# Patient Record
Sex: Male | Born: 1960 | Race: White | Hispanic: No | Marital: Married | Smoking: Former smoker
Health system: Southern US, Community
[De-identification: ages and names within clinical notes are randomized; demographics above are authoritative.]

## PROBLEM LIST (undated history)

## (undated) HISTORY — PX: NO PAST SURGERIES: SHX2092

---

## 2020-09-12 ENCOUNTER — Encounter (HOSPITAL_BASED_OUTPATIENT_CLINIC_OR_DEPARTMENT_OTHER): Payer: Self-pay | Admitting: Emergency Medicine

## 2020-09-12 ENCOUNTER — Emergency Department (HOSPITAL_BASED_OUTPATIENT_CLINIC_OR_DEPARTMENT_OTHER)
Admission: EM | Admit: 2020-09-12 | Discharge: 2020-09-12 | Disposition: A | Discharge status: Home / Self Care | Payer: BC Managed Care – PPO | Attending: Emergency Medicine | Admitting: Emergency Medicine

## 2020-09-12 ENCOUNTER — Emergency Department (HOSPITAL_BASED_OUTPATIENT_CLINIC_OR_DEPARTMENT_OTHER): Payer: BC Managed Care – PPO

## 2020-09-12 ENCOUNTER — Other Ambulatory Visit: Payer: Self-pay

## 2020-09-12 DIAGNOSIS — Z87891 Personal history of nicotine dependence: Secondary | ICD-10-CM | POA: Diagnosis not present

## 2020-09-12 DIAGNOSIS — G510 Bell's palsy: Secondary | ICD-10-CM | POA: Diagnosis present

## 2020-09-12 DIAGNOSIS — Z79899 Other long term (current) drug therapy: Secondary | ICD-10-CM | POA: Insufficient documentation

## 2020-09-12 MED ORDER — VALACYCLOVIR HCL 500 MG PO TABS
1000.0000 mg | ORAL_TABLET | ORAL | Status: AC
  Administered 2020-09-12: 1000 mg via ORAL
  Filled 2020-09-12: qty 2

## 2020-09-12 MED ORDER — VALACYCLOVIR HCL 1 G PO TABS: 1000.0000 mg | ORAL_TABLET | Freq: Three times a day (TID) | ORAL | 0 refills | Status: AC

## 2020-09-12 MED ORDER — PREDNISONE 50 MG PO TABS
60.0000 mg | ORAL_TABLET | Freq: Once | ORAL | Status: AC
  Administered 2020-09-12: 60 mg via ORAL
  Filled 2020-09-12: qty 1

## 2020-09-12 MED ORDER — PREDNISONE 20 MG PO TABS: ORAL_TABLET | ORAL | 0 refills | Status: DC

## 2020-09-12 NOTE — ED Triage Notes (Addendum)
Pt with several complaints. States this started with sores on his tongue and swelling of his tonsils. States that this is ongoing since labor day. Has been seen twice by an MD. C/o the right side of his face feeling numb and his right eye feeling like his right eye feels dry. Appears to be a bells palsy.

## 2020-09-12 NOTE — ED Provider Notes (Signed)
[facility] EMERGENCY DEPARTMENT Provider Note   CSN: [id number] Arrival date & time: 09/12/20  0534     History Chief Complaint  Patient presents with  . bells palsy    Drew Rose is a 59 y.o. male.  The history is provided by the patient.  Illness Location:  Face Quality:  Droop and eye feels gritty and taste is off  Severity:  Moderate Onset quality:  Gradual Timing:  Constant Progression:  Unchanged Chronicity:  New Context:  Had ulcers in the mouth around labor day  Relieved by:  Nothing  Worsened by:  Nothing  Ineffective treatments:  Was on antibiotics  Associated symptoms: no abdominal pain, no chest pain, no congestion, no cough, no diarrhea, no ear pain, no fatigue, no fever, no headaches, no loss of consciousness, no myalgias, no nausea, no rash, no rhinorrhea, no shortness of breath, no sore throat, no vomiting and no wheezing   Risk factors:  None       History reviewed. No pertinent past medical history.  There are no problems to display for this patient.   History reviewed. No pertinent surgical history.     History reviewed. No pertinent family history.  Social History   Tobacco Use  . Smoking status: Former Games developer  . Smokeless tobacco: Never Used  Substance Use Topics  . Alcohol use: Yes    Comment: occasional   . Drug use: Yes    Types: Marijuana    Home Medications Prior to Admission medications   Medication Sig Start Date End Date Taking? Authorizing Provider  amoxicillin-clavulanate (AUGMENTIN) 875-125 MG tablet Take 1 tablet by mouth 2 (two) times daily.   Yes [provider]    Allergies    Patient has no allergy information on record.  Review of Systems   Review of Systems  Constitutional: Negative for fatigue and fever.  HENT: Positive for mouth sores. Negative for congestion, ear pain, rhinorrhea and sore throat.   Eyes: Negative for pain, redness and visual disturbance.  Respiratory: Negative for  cough, shortness of breath and wheezing.   Cardiovascular: Negative for chest pain.  Gastrointestinal: Negative for abdominal pain, diarrhea, nausea and vomiting.  Genitourinary: Negative for dysuria.  Musculoskeletal: Negative for myalgias.  Skin: Negative for rash.  Neurological: Positive for facial asymmetry. Negative for loss of consciousness, speech difficulty, weakness, numbness and headaches.  Psychiatric/Behavioral: Negative for agitation.  All other systems reviewed and are negative.   Physical Exam Updated Vital Signs BP (!) 155/102   Pulse 77   Temp 98.3 F (36.8 C)   Resp 18   Ht 5\' 9"  (1.753 m)   Wt 83.5 kg   SpO2 98%   BMI 27.17 kg/m   Physical Exam Vitals and nursing note reviewed. Exam conducted with a chaperone present.  Constitutional:      Appearance: Normal appearance.  HENT:     Head: Normocephalic and atraumatic.     Nose: Nose normal.     Mouth/Throat:     Mouth: Mucous membranes are moist.     Pharynx: Oropharynx is clear.     Comments: No oral lesions nor swelling of the tonsils  Eyes:     Extraocular Movements: Extraocular movements intact.     Conjunctiva/sclera: Conjunctivae normal.     Pupils: Pupils are equal, round, and reactive to light.  Cardiovascular:     Rate and Rhythm: Normal rate and regular rhythm.  Pulmonary:     Effort: Pulmonary effort is normal.  Breath sounds: Normal breath sounds.  Abdominal:     General: Abdomen is flat. Bowel sounds are normal.     Palpations: Abdomen is soft.     Tenderness: There is no abdominal tenderness. There is no guarding.  Musculoskeletal:        General: Normal range of motion.     Cervical back: Normal range of motion and neck supple.  Skin:    General: Skin is warm and dry.     Capillary Refill: Capillary refill takes less than 2 seconds.  Neurological:     General: No focal deficit present.     Mental Status: He is alert and oriented to person, place, and time.     Deep Tendon  Reflexes: Reflexes normal.     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  Swelling of tonsils. EXAM: CHEST - 2 VIEW COMPARISON:  None. FINDINGS: The heart size and mediastinal contours are within normal limits. Both lungs are clear. 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 IMPRESSION: No active cardiopulmonary disease. Electronically Signed   By: Signa Kell M.D.   On: 09/12/2020 06:23   CT Head Wo Contrast  Result Date: 09/12/2020 CLINICAL DATA:  Maxillofacial pain which began with sore on tongue and swelling up tonsils. EXAM: CT HEAD WITHOUT CONTRAST TECHNIQUE: Contiguous axial images were obtained from the base of the skull through the vertex without intravenous contrast. COMPARISON:  None. FINDINGS: Brain: No evidence of acute infarction, hemorrhage, hydrocephalus, extra-axial collection or mass lesion/mass effect. Vascular: No hyperdense vessel or unexpected calcification. Skull: Normal. Negative for fracture or focal lesion. Sinuses/Orbits: No acute finding. Other: None. IMPRESSION: No acute intracranial abnormalities. Normal brain. Electronically Signed   By: Signa Kell M.D.   On: 09/12/2020 06:27    Procedures Procedures (including critical care time)  Medications Ordered in ED Medications  valACYclovir (VALTREX) tablet 1,000 mg (1,000 mg Oral Given 09/12/20 8182)  predniSONE (DELTASONE) tablet 60 mg (60 mg Oral Given 09/12/20 0608)    ED Course  I have reviewed the triage vital signs and the nursing notes.  Pertinent labs & imaging results that were available during my care of the patient were reviewed by me and considered in my medical decision making (see chart for  details).  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  I suspect the mouth lesions were herpetic in nature, but they are now resolved.  I will start valtex and steroids and have patient follow up with PMD.  I recommend rewetting drops for the eye to keep the cornea lubricated.  Follow up with eye care professional for recheck.    Lamarius Dirr was evaluated in Emergency Department on 09/12/2020 for the symptoms described in the history of present illness. He was evaluated in the context of the global COVID-19 pandemic, which necessitated consideration that the patient might be at risk for infection with the SARS-CoV-2 virus that causes COVID-19. Institutional protocols and algorithms that pertain to the evaluation of patients at risk for COVID-19 are in a state of rapid change based on information released by regulatory bodies including the CDC and federal and state organizations. These policies and algorithms  were followed during the patient's care in the ED.   Final Clinical Impression(s) / ED Diagnoses Return for intractable cough, coughing up blood,fevers >100.4 unrelieved by medication, shortness of breath, intractable vomiting, chest pain, shortness of breath, weakness,numbness, changes in speech, facial asymmetry,abdominal pain, passing out,Inability to tolerate liquids or food, cough, altered mental status or any concerns. No signs of systemic illness or infection. The patient is nontoxic-appearing on exam and vital signs are within normal limits.   I have reviewed the triage vital signs and the nursing notes. Pertinent labs &imaging results that were available during my care of the patient were reviewed by me and considered in my medical decision making (see chart for details).After history, exam, and medical workup I feel the patient has beenappropriately medically screened and is safe for discharge home. Pertinent diagnoses were discussed with the patient. Patient  was given return precautions.   Sherece Gambrill, MD 09/12/20 0762

## 2020-09-18 ENCOUNTER — Emergency Department (HOSPITAL_BASED_OUTPATIENT_CLINIC_OR_DEPARTMENT_OTHER): Payer: BC Managed Care – PPO

## 2020-09-18 ENCOUNTER — Emergency Department (HOSPITAL_BASED_OUTPATIENT_CLINIC_OR_DEPARTMENT_OTHER)
Admission: EM | Admit: 2020-09-18 | Discharge: 2020-09-19 | Disposition: A | Discharge status: Home / Self Care | Payer: BC Managed Care – PPO | Attending: Emergency Medicine | Admitting: Emergency Medicine

## 2020-09-18 ENCOUNTER — Encounter (HOSPITAL_BASED_OUTPATIENT_CLINIC_OR_DEPARTMENT_OTHER): Payer: Self-pay | Admitting: Emergency Medicine

## 2020-09-18 ENCOUNTER — Other Ambulatory Visit: Payer: Self-pay

## 2020-09-18 DIAGNOSIS — R109 Unspecified abdominal pain: Secondary | ICD-10-CM | POA: Insufficient documentation

## 2020-09-18 DIAGNOSIS — R112 Nausea with vomiting, unspecified: Secondary | ICD-10-CM | POA: Diagnosis not present

## 2020-09-18 DIAGNOSIS — Z20822 Contact with and (suspected) exposure to covid-19: Secondary | ICD-10-CM | POA: Insufficient documentation

## 2020-09-18 DIAGNOSIS — Z87891 Personal history of nicotine dependence: Secondary | ICD-10-CM | POA: Diagnosis not present

## 2020-09-18 DIAGNOSIS — R066 Hiccough: Secondary | ICD-10-CM

## 2020-09-18 LAB — COMPREHENSIVE METABOLIC PANEL
ALT: 60 U/L — ABNORMAL HIGH (ref 0–44)
AST: 33 U/L (ref 15–41)
Albumin: 4 g/dL (ref 3.5–5.0)
Alkaline Phosphatase: 47 U/L (ref 38–126)
Anion gap: 13 (ref 5–15)
BUN: 24 mg/dL — ABNORMAL HIGH (ref 6–20)
CO2: 19 mmol/L — ABNORMAL LOW (ref 22–32)
Calcium: 9.8 mg/dL (ref 8.9–10.3)
Chloride: 104 mmol/L (ref 98–111)
Creatinine, Ser: 1.06 mg/dL (ref 0.61–1.24)
GFR calc Af Amer: >60 mL/min (ref >60)
GFR calc non Af Amer: >60 mL/min (ref >60)
Glucose, Bld: 108 mg/dL — ABNORMAL HIGH (ref 70–99)
Potassium: 3.9 mmol/L (ref 3.5–5.1)
Sodium: 136 mmol/L (ref 135–145)
Total Bilirubin: 1 mg/dL (ref 0.3–1.2)
Total Protein: 7.2 g/dL (ref 6.5–8.1)

## 2020-09-18 LAB — TROPONIN I (HIGH SENSITIVITY)
Troponin I (High Sensitivity): 3 ng/L (ref <18)
Troponin I (High Sensitivity): 3 ng/L (ref <18)

## 2020-09-18 LAB — URINALYSIS, ROUTINE W REFLEX MICROSCOPIC
Bilirubin Urine: NEGATIVE
Glucose, UA: NEGATIVE mg/dL
Hgb urine dipstick: NEGATIVE
Ketones, ur: >80 mg/dL — AB
Leukocytes,Ua: NEGATIVE
Nitrite: NEGATIVE
Protein, ur: NEGATIVE mg/dL
Specific Gravity, Urine: 1.01 (ref 1.005–1.030)
pH: 8 (ref 5.0–8.0)

## 2020-09-18 LAB — CBC
HCT: 37.8 % — ABNORMAL LOW (ref 39.0–52.0)
Hemoglobin: 13.3 g/dL (ref 13.0–17.0)
MCH: 31.1 pg (ref 26.0–34.0)
MCHC: 35.2 g/dL (ref 30.0–36.0)
MCV: 88.3 fL (ref 80.0–100.0)
Platelets: 415 10*3/uL — ABNORMAL HIGH (ref 150–400)
RBC: 4.28 MIL/uL (ref 4.22–5.81)
RDW: 13 % (ref 11.5–15.5)
WBC: 11.4 10*3/uL — ABNORMAL HIGH (ref 4.0–10.5)
nRBC: 0 % (ref 0.0–0.2)

## 2020-09-18 LAB — RESPIRATORY PANEL BY RT PCR (FLU A&B, COVID)
Influenza A by PCR: NEGATIVE
Influenza B by PCR: NEGATIVE
SARS Coronavirus 2 by RT PCR: NEGATIVE

## 2020-09-18 LAB — LIPASE, BLOOD: Lipase: 27 U/L (ref 11–51)

## 2020-09-18 MED ORDER — IOHEXOL 300 MG/ML  SOLN
100.0000 mL | Freq: Once | INTRAMUSCULAR | Status: AC | PRN
  Administered 2020-09-18: 100 mL via INTRAVENOUS

## 2020-09-18 MED ORDER — SODIUM CHLORIDE 0.9 % IV BOLUS
1000.0000 mL | Freq: Once | INTRAVENOUS | Status: AC
  Administered 2020-09-18: 1000 mL via INTRAVENOUS

## 2020-09-18 MED ORDER — METOCLOPRAMIDE HCL 5 MG/ML IJ SOLN
10.0000 mg | Freq: Once | INTRAMUSCULAR | Status: AC
  Administered 2020-09-18: 10 mg via INTRAVENOUS
  Filled 2020-09-18: qty 2

## 2020-09-18 MED ORDER — ONDANSETRON HCL 4 MG/2ML IJ SOLN: 4.0000 mg | Freq: Once | INTRAMUSCULAR | Status: DC | PRN

## 2020-09-18 MED ORDER — PROMETHAZINE HCL 25 MG PO TABS: 25.0000 mg | ORAL_TABLET | Freq: Four times a day (QID) | ORAL | 0 refills | Status: DC | PRN

## 2020-09-18 MED ORDER — ONDANSETRON HCL 4 MG/2ML IJ SOLN
4.0000 mg | Freq: Once | INTRAMUSCULAR | Status: AC
  Administered 2020-09-18: 4 mg via INTRAVENOUS
  Filled 2020-09-18: qty 2

## 2020-09-18 MED ORDER — OMEPRAZOLE 20 MG PO CPDR: 20.0000 mg | DELAYED_RELEASE_CAPSULE | Freq: Every day | ORAL | 0 refills | Status: AC

## 2020-09-18 MED ORDER — DIPHENHYDRAMINE HCL 50 MG/ML IJ SOLN
25.0000 mg | Freq: Once | INTRAMUSCULAR | Status: AC
  Administered 2020-09-18: 25 mg via INTRAVENOUS
  Filled 2020-09-18: qty 1

## 2020-09-18 NOTE — ED Triage Notes (Signed)
Pt here with emesis since yesterday evening.

## 2020-09-18 NOTE — Progress Notes (Signed)
Pt dry heaving and states needs to get that under control before he can proceed with CT, RN aware.

## 2020-09-18 NOTE — ED Notes (Signed)
Pt encouraged to void

## 2020-09-18 NOTE — ED Provider Notes (Signed)
[facility] EMERGENCY DEPARTMENT Provider Note   CSN: [id number] Arrival date & time: 09/18/20  1740     History Chief Complaint  Patient presents with  . Emesis    Drew Rose is a 59 y.o. male.  HPI      Last night 8PM started to have nausea, vomiting Dry heaves, hiccups Slept ok, woke up early, then slept this afternoon however symptoms have continued Tried drinking water, applesauce  Dry heaves, hiccups. havent been able to keep much of anything down, did have pedialyte this AM and kept that down.  2 weeks ago had ulcers and plaques in back of tongue, sore throat, got prednisone and amoxicillin, dry heaves Then diagnosed with bells palsy, prednisone/valtrex  This time no sore throat  No headache, denies numbness, weakness, difficulty talking or walking, visual changes-no change in bells palsy   No chest pain or dyspnea  Abdominal pain epigastric area  No known sick contacts  History reviewed. No pertinent past medical history.  There are no problems to display for this patient.   History reviewed. No pertinent surgical history.     History reviewed. No pertinent family history.  Social History   Tobacco Use  . Smoking status: Former Games developer  . Smokeless tobacco: Never Used  Substance Use Topics  . Alcohol use: Yes    Comment: occasional   . Drug use: Yes    Types: Marijuana    Home Medications Prior to Admission medications   Medication Sig Start Date End Date Taking? Authorizing Provider  amoxicillin-clavulanate (AUGMENTIN) 875-125 MG tablet Take 1 tablet by mouth 2 (two) times daily.    [provider]  omeprazole (PRILOSEC) 20 MG capsule Take 1 capsule (20 mg total) by mouth daily. 09/18/20   Palumbo, April, MD  predniSONE (DELTASONE) 20 MG tablet 3 tabs po day one, then 2 po daily x 4 days 09/12/20   Nicanor Alcon, April, MD  promethazine (PHENERGAN) 25 MG tablet Take 1 tablet (25 mg total) by mouth every 6 (six) hours as  needed for nausea or vomiting. 09/18/20   Palumbo, April, MD  valACYclovir (VALTREX) 1000 MG tablet Take 1 tablet (1,000 mg total) by mouth 3 (three) times daily for 7 days. 09/12/20 09/19/20  Palumbo, April, MD    Allergies    Patient has no known allergies.  Review of Systems   Review of Systems  Constitutional: Negative for fever.  HENT: Negative for sore throat.   Eyes: Negative for visual disturbance.  Respiratory: Negative for shortness of breath.   Cardiovascular: Negative for chest pain.  Gastrointestinal: Positive for abdominal pain, constipation, nausea and vomiting. Negative for diarrhea.  Genitourinary: Negative for difficulty urinating.  Musculoskeletal: Negative for back pain and neck stiffness.  Skin: Negative for rash.  Neurological: Positive for facial asymmetry and light-headedness. Negative for dizziness, syncope, speech difficulty, weakness, numbness and headaches.    Physical Exam Updated Vital Signs BP 122/69   Pulse 62   Temp 98.9 F (37.2 C) (Oral)   Resp 17   SpO2 97%   Physical Exam Vitals and nursing note reviewed.  Constitutional:      General: He is not in acute distress.    Appearance: He is well-developed. He is not diaphoretic.  HENT:     Head: Normocephalic and atraumatic.  Eyes:     Conjunctiva/sclera: Conjunctivae normal.  Cardiovascular:     Rate and Rhythm: Normal rate and regular rhythm.     Heart sounds: Normal heart sounds. No murmur  heard.  No friction rub. No gallop.   Pulmonary:     Effort: Pulmonary effort is normal. No respiratory distress.     Breath sounds: Normal breath sounds. No wheezing or rales.  Abdominal:     General: There is no distension.     Palpations: Abdomen is soft.     Tenderness: There is no abdominal tenderness. There is no guarding.  Musculoskeletal:     Cervical back: Normal range of motion.  Skin:    General: Skin is warm and dry.  Neurological:     Mental Status: He is alert and oriented to  person, place, and time.     ED Results / Procedures / Treatments   Labs (all labs ordered are listed, but only abnormal results are displayed) Labs Reviewed  COMPREHENSIVE METABOLIC PANEL - Abnormal; Notable for the following components:      Result Value   CO2 19 (*)    Glucose, Bld 108 (*)    BUN 24 (*)    ALT 60 (*)    All other components within normal limits  CBC - Abnormal; Notable for the following components:   WBC 11.4 (*)    HCT 37.8 (*)    Platelets 415 (*)    All other components within normal limits  URINALYSIS, ROUTINE W REFLEX MICROSCOPIC - Abnormal; Notable for the following components:   APPearance CLOUDY (*)    Ketones, ur >80 (*)    All other components within normal limits  RESPIRATORY PANEL BY RT PCR (FLU A&B, COVID)  LIPASE, BLOOD  TROPONIN I (HIGH SENSITIVITY)  TROPONIN I (HIGH SENSITIVITY)    EKG EKG Interpretation  Date/Time:  Saturday September 18 2020 19:12:14 EDT Ventricular Rate:  63 PR Interval:    QRS Duration: 100 QT Interval:  393 QTC Calculation: 403 R Axis:   -38 Text Interpretation: Sinus rhythm Left axis deviation Low voltage, precordial leads Abnormal R-wave progression, early transition No previous ECGs available Confirmed by Alvira Monday ([id number]) on 09/18/2020 8:27:04 PM   Radiology CT ABDOMEN PELVIS W CONTRAST  Result Date: 09/18/2020 CLINICAL DATA:  Diverticulitis suspected.  Vomiting. EXAM: CT ABDOMEN AND PELVIS WITH CONTRAST TECHNIQUE: Multidetector CT imaging of the abdomen and pelvis was performed using the standard protocol following bolus administration of intravenous contrast. CONTRAST:  OMNIPAQUE IOHEXOL 300 MG/ML  SOLN COMPARISON:  None. FINDINGS: Lower chest: Lung bases are clear. No consolidation or pleural fluid. Hepatobiliary: No focal liver abnormality is seen. Mild decreased hepatic density consistent with steatosis. No gallstones, gallbladder wall thickening, or biliary dilatation. Pancreas: No ductal  dilatation or inflammation. Spleen: Normal in size without focal abnormality. Adrenals/Urinary Tract: Normal adrenal glands. No hydronephrosis or perinephric edema. Homogeneous renal enhancement with symmetric excretion on delayed phase imaging. Tiny cortical hypodensity in the upper right and lower left kidney are too small to characterize but likely small cyst. Urinary bladder is physiologically distended without wall thickening. Stomach/Bowel: Decompressed stomach. Normal positioning of the duodenum and ligament of Treitz. No small bowel obstruction, BS wall thickening or inflammatory change. Normal appendix. Moderate volume of stool throughout the colon. Diverticulosis involving the descending and sigmoid colon. No acute diverticulitis or colonic wall thickening. No pericolonic edema. Vascular/Lymphatic: Aorto bi-iliac atherosclerosis. No aortic aneurysm. Circumaortic left renal vein. Patent portal vein. No bulky abdominopelvic adenopathy. Reproductive: Prostate is unremarkable. Other: No free air, free fluid, or intra-abdominal fluid collection. Minimal fat in the inguinal canals. 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 There is mild  degenerative change in the spine. No acute osseous abnormalities. IMPRESSION: 1. No acute abnormality in the abdomen/pelvis. 2. Colonic diverticulosis without diverticulitis. 3. Mild hepatic steatosis. 4. 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 Aortic Atherosclerosis (ICD10-I70.0). Electronically Signed   By: Narda Rutherford M.D.   On: 09/18/2020 21:14   DG Chest Portable 1 View  Result Date: 09/18/2020 CLINICAL DATA:  Hiccups.  Emesis. EXAM: PORTABLE CHEST 1 VIEW COMPARISON:  09/12/2020 FINDINGS: The cardiomediastinal contours are normal. No pneumomediastinum. The lungs are clear. Pulmonary vasculature is normal. No consolidation, pleural effusion, or pneumothorax. No acute osseous abnormalities are seen.  IMPRESSION: No acute chest findings. Electronically Signed   By: Narda Rutherford M.D.   On: 09/18/2020 21:15    Procedures Procedures (including critical care time)  Medications Ordered in ED Medications  sodium chloride 0.9 % bolus 1,000 mL (0 mLs Intravenous Stopped 09/18/20 2018)  ondansetron (ZOFRAN) injection 4 mg (4 mg Intravenous Given 09/18/20 1941)  iohexol (OMNIPAQUE) 300 MG/ML solution 100 mL (100 mLs Intravenous Contrast Given 09/18/20 2056)  metoCLOPramide (REGLAN) injection 10 mg (10 mg Intravenous Given 09/18/20 2031)  diphenhydrAMINE (BENADRYL) injection 25 mg (25 mg Intravenous Given 09/18/20 2031)    ED Course  I have reviewed the triage vital signs and the nursing notes.  Pertinent labs & imaging results that were available during my care of the patient were reviewed by me and considered in my medical decision making (see chart for details).    MDM Rules/Calculators/A&P                          59yo male with recent presentation with nausea/vomiting oral/pharyngeal ulcers to urgent care treated with amoxicillin/prednisone followed by Bells Palsy treated with valacylcovir and prednisone presents with concern for nausea, vomiting, hiccups, abdominal pain.   DDx includes ACS, SBO, diverticulitis, pancreatitis, neprholithiasis, viral etiologies, gastritis secondary to prednisone.  EKG< troponin, CXR without acute findings. CT abdomen pelvis without acute findings. No sign of UTI.  Given fluids for dehydration, reglan and benadryl with improvement of nausea/vomiting.  Recommend follow up with PCP. Given rx for PPI, phenergan for nausea. (no relief with zofran, declined reglan given akithisia that occurred with IV form.)  Patient discharged in stable condition with understanding of reasons to return.      Final Clinical Impression(s) / ED Diagnoses Final diagnoses:  Non-intractable vomiting with nausea, unspecified vomiting type  Hiccups    Rx / DC Orders ED  Discharge Orders         Ordered    omeprazole (PRILOSEC) 20 MG capsule  Daily        09/18/20 2359    promethazine (PHENERGAN) 25 MG tablet  Every 6 hours PRN        09/18/20 2359           Alvira Monday, MD 09/19/20 1124

## 2020-09-18 NOTE — ED Notes (Signed)
Pt called out reporting feeling like he "couldn't breathe" and felt "panicked" after administration of Reglan; Reglan had been mixed in a saline flush and pushed over ; this RN and Charge re-assessed pt, who did not display signs of respiratory distress and pt reported feeling better after a couple of minutes. EDP made aware

## 2020-09-18 NOTE — ED Notes (Signed)
PT to CT.

## 2020-09-21 ENCOUNTER — Ambulatory Visit (INDEPENDENT_AMBULATORY_CARE_PROVIDER_SITE_OTHER): Payer: BC Managed Care – PPO | Admitting: Gastroenterology

## 2020-09-21 ENCOUNTER — Encounter: Payer: Self-pay | Admitting: Gastroenterology

## 2020-09-21 VITALS — BP 102/68 | HR 75 | Ht 69.0 in | Wt 177.5 lb

## 2020-09-21 DIAGNOSIS — R1310 Dysphagia, unspecified: Secondary | ICD-10-CM

## 2020-09-21 DIAGNOSIS — Z8669 Personal history of other diseases of the nervous system and sense organs: Secondary | ICD-10-CM

## 2020-09-21 DIAGNOSIS — R1110 Vomiting, unspecified: Secondary | ICD-10-CM | POA: Diagnosis not present

## 2020-09-21 DIAGNOSIS — R932 Abnormal findings on diagnostic imaging of liver and biliary tract: Secondary | ICD-10-CM

## 2020-09-21 DIAGNOSIS — K5790 Diverticulosis of intestine, part unspecified, without perforation or abscess without bleeding: Secondary | ICD-10-CM

## 2020-09-21 DIAGNOSIS — R7989 Other specified abnormal findings of blood chemistry: Secondary | ICD-10-CM | POA: Diagnosis not present

## 2020-09-21 MED ORDER — ONDANSETRON 8 MG PO TBDP: 8.0000 mg | ORAL_TABLET | Freq: Three times a day (TID) | ORAL | 2 refills | Status: AC | PRN

## 2020-09-21 NOTE — Patient Instructions (Addendum)
If you are age 60 or older, your body mass index should be between 23-30. Your Body mass index is 26.21 kg/m. If this is out of the aforementioned range listed, please consider follow up with your Primary Care Provider.  If you are age 19 or younger, your body mass index should be between 19-25. Your Body mass index is 26.21 kg/m. If this is out of the aformentioned range listed, please consider follow up with your Primary Care Provider.   You have been scheduled for an endoscopy. Please follow written instructions given to you at your visit today. If you use inhalers (even only as needed), please bring them with you on the day of your procedure.  STOP: Phenergan  START:  Zofran - Use the Zofran at least 1-2 times daily in the morning and evening as needed. RX has been sent to pharmacy.   We have sent the following medications to your pharmacy for you to pick up at your convenience: Zofran   Continue Omeprazole 20mg  - once daily.   We have made a referral to Neurology for Hx of Bells Palsy. Their office will contact you for an appointment. If you have not heard from their office in 1-2 weeks , please contact their office @ [phone]  Thank you for choosing me and [facility].  Dr. [phone]

## 2020-09-23 ENCOUNTER — Encounter: Payer: Self-pay | Admitting: Gastroenterology

## 2020-09-23 DIAGNOSIS — K5790 Diverticulosis of intestine, part unspecified, without perforation or abscess without bleeding: Secondary | ICD-10-CM | POA: Insufficient documentation

## 2020-09-23 DIAGNOSIS — Z8669 Personal history of other diseases of the nervous system and sense organs: Secondary | ICD-10-CM | POA: Insufficient documentation

## 2020-09-23 DIAGNOSIS — R1310 Dysphagia, unspecified: Secondary | ICD-10-CM | POA: Insufficient documentation

## 2020-09-23 DIAGNOSIS — R932 Abnormal findings on diagnostic imaging of liver and biliary tract: Secondary | ICD-10-CM | POA: Insufficient documentation

## 2020-09-23 DIAGNOSIS — R1110 Vomiting, unspecified: Secondary | ICD-10-CM | POA: Insufficient documentation

## 2020-09-23 DIAGNOSIS — R7989 Other specified abnormal findings of blood chemistry: Secondary | ICD-10-CM | POA: Insufficient documentation

## 2020-09-23 NOTE — Progress Notes (Signed)
GASTROENTEROLOGY OUTPATIENT CLINIC VISIT   Primary Care Provider Patient, No Pcp Per No address on file None  Referring Provider No referring provider defined for this encounter.   Patient Profile: Drew Rose is a 59 y.o. male with a pmh significant for recently diagnosed Bell's palsy, CT finding of diverticulosis (patient never knew).  The patient presents to the [facility] for an evaluation and management of problem(s) noted below:  Problem List 1. Dysphagia, unspecified type   2. Dry heaves   3. Hx of Bell's palsy   4. Elevated LFTs   5. Abnormal CT of liver   6. Diverticulosis     History of Present Illness This is the patient's first visit to the outpatient [facility].  Patient was in the last 4 and issues develop.  He was diagnosed with possible tonsillitis care.  Prescribed viscous lidocaine which helped with findings of sores on his.  However, he still having issues and of significant difficulty swallowing dysphagia.  He followed up the department and was found to have new onset Bell's palsy.  He was treated with Valtrex therapy.  Having issues with difficulty swallowing at times.  This is slowly improving.  However he has continued dry heaving as well as sneezing.  Does not have overt nausea per se but Phenergan has not been that helpful for him.  It seems when he was given IV Reglan to help with his symptoms and he had no longer drives anything but he had some sort of akathisia develop.  Only tolerating liquids at this time.  He is already been given prednisone as well as Augmentin Valtrex and as previously described.  Again for him.  He does not have neurology follow-up.  He states he has had acid reflux infrequently over the course of his life and is not something that is really there.  He is very concerned that he is having the weight loss from his change in diet.  The patient does not take significant nonsteroidals or BC/Goody powders.  He is  taking PPI therapy without much effect.  He has never had an upper or lower endoscopy.  GI Review of Systems Positive as above Negative for odynophagia, hematemesis, coffee-ground emesis, decreased appetite, early satiety, change in bowel habits, melena, hematochezia  Review of Systems General: Denies fevers/chills HEENT: Denies current oral lesions Cardiovascular: Denies chest pain/palpitations Pulmonary: Denies shortness of breath/cough Gastroenterological: See HPI Genitourinary: Denies darkened urine Hematological: Denies easy bruising/bleeding Endocrine: Denies temperature intolerance Dermatological: Denies jaundice Psychological: Mood is anxious to get better   Medications Current Outpatient Medications  Medication Sig Dispense Refill  . omeprazole (PRILOSEC) 20 MG capsule Take 1 capsule (20 mg total) by mouth daily. 30 capsule 0  . ondansetron (ZOFRAN ODT) 8 MG disintegrating tablet Take 1 tablet (8 mg total) by mouth every 8 (eight) hours as needed for nausea or vomiting. 30 tablet 2   No current facility-administered medications for this visit.    Allergies No Known Allergies  Histories History reviewed. No pertinent past medical history. Past Surgical History:  Procedure Laterality Date  . NO PAST SURGERIES     Social History   Socioeconomic History  . Marital status: Married    Spouse name: Not on file  . Number of children: Not on file  . Years of education: Not on file  . Highest education level: Not on file  Occupational History  . Not on file  Tobacco Use  . Smoking status: Former Smoker  Types: Cigarettes  . Smokeless tobacco: Never Used  Vaping Use  . Vaping Use: Former  Substance and Sexual Activity  . Alcohol use: Yes    Comment: 2 beers  . Drug use: Yes    Types: Marijuana  . Sexual activity: Not on file  Other Topics Concern  . Not on file  Social History Narrative  . Not on file   Social Determinants of Health   Financial  Resource Strain:   . Difficulty of Paying Living Expenses: Not on file  Food Insecurity:   . Worried About Programme researcher, broadcasting/film/video in the Last Year: Not on file  . Ran Out of Food in the Last Year: Not on file  Transportation Needs:   . Lack of Transportation (Medical): Not on file  . Lack of Transportation (Non-Medical): Not on file  Physical Activity:   . Days of Exercise per Week: Not on file  . Minutes of Exercise per Session: Not on file  Stress:   . Feeling of Stress : Not on file  Social Connections:   . Frequency of Communication with Friends and Family: Not on file  . Frequency of Social Gatherings with Friends and Family: Not on file  . Attends Religious Services: Not on file  . Active Member of Clubs or Organizations: Not on file  . Attends Banker Meetings: Not on file  . Marital Status: Not on file  Intimate Partner Violence:   . Fear of Current or Ex-Partner: Not on file  . Emotionally Abused: Not on file  . Physically Abused: Not on file  . Sexually Abused: Not on file   Family History  Problem Relation Age of Onset  . Heart murmur Mother   . Colon cancer Neg Hx   . Esophageal cancer Neg Hx   . Pancreatic cancer Neg Hx   . Stomach cancer Neg Hx   . Inflammatory bowel disease Neg Hx   . Liver disease Neg Hx   . Rectal cancer Neg Hx    I have reviewed his medical, social, and family history in detail and updated the electronic medical record as necessary.    PHYSICAL EXAMINATION  BP 102/68 (BP Location: Left Arm, Patient Position: Sitting, Cuff Size: Normal)   Pulse 75   Ht 5\' 9"  (1.753 m)   Wt 177 lb 8 oz (80.5 kg)   SpO2 98%   BMI 26.21 kg/m  Wt Readings from Last 3 Encounters:  09/21/20 177 lb 8 oz (80.5 kg)  09/12/20 184 lb (83.5 kg)  GEN: NAD, appears stated age, doesn't appear chronically ill PSYCH: Cooperative, without pressured speech EYE: Conjunctivae pink, sclerae anicteric ENT: MMM, without oral ulcers, no erythema or exudates  noted NECK: Supple CV: Nontachycardic RESP: No audible wheezing GI: NABS, soft, NT/ND, without rebound or guarding, no HSM appreciated MSK/EXT: No lower extremity edema SKIN: No jaundice NEURO: Obvious right-sided facial droop is present, oral airway opens completely with an adequate response of uvula upwards, no other focal deficits rest of the patient's upper extremity evaluation    REVIEW OF DATA  I reviewed the following data at the time of this encounter:  GI Procedures and Studies  No relevant studies to review  Laboratory Studies  Reviewed those in epic  Imaging Studies  September 2021 CT head without contrast IMPRESSION: No acute intracranial abnormalities. Normal brain.  September 2021 CT abdomen pelvis IMPRESSION: 1. No acute abnormality in the abdomen/pelvis. 2. Colonic diverticulosis without diverticulitis. 3. Mild hepatic steatosis.  4. 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   ASSESSMENT  Drew Rose is a 59 y.o. male with a pmh significant for recently diagnosed Bell's palsy, CT finding of diverticulosis (patient never knew).  The patient is seen today for evaluation and management of:  1. Dysphagia, unspecified type   2. Dry heaves   3. Hx of Bell's palsy   4. Elevated LFTs   5. Abnormal CT of liver   6. Diverticulosis    The patient is hemodynamically stable.  Clinically however the patient continues to have significant issues of dry heaving as well as difficulty tolerating oral intake more than liquids.  Etiology of this is not completely defined.  I wonder if there is some issues as a result of his underlying recently diagnosed Bell's palsy.  He will need neurology follow-up due to his persistence of symptoms at this time and we will place a referral for that.  We will transition his an antiemetic to Zofran oral dissolving tablets in an effort of trying to use that times daily and see if that helps with his dry heaving.  Certainly,  anoxic endoscopy is reasonable to evaluate symptoms although.  If this patient has longstanding heartburn do think that an esophageal stricturing or webs will be present but we can certainly rule out that he does not have esophagitis or other issues such as EOE or esophagitis.  However this is unremarkable he continues to have difficulty swallowing I think a SLP evaluation will be reasonable.  Patient has never had colon cancer screening and will need to have that done some point but we will place that on the back burner for now due to his current acute issues. He also has evidence on the CT scan of the possible fatty liver/steatosis and he has a slightly elevated ALT.  This will require additional work-up as well but that will be placed on the back burner for now which is acute issues.   PLAN  Stop Phenergan Start Zofran 2-3 times daily oral dissolving tablet as needed Diagnostic endoscopy with esophageal and gastric and duodenal biopsies to be obtained -Will be scheduled out approximately 1 to 2 weeks in case she continues to have some improvement in where he can hold on that but otherwise will move forward soon If patient continues to have issues and diagnostic endoscopy with possible dilation is unremarkable or not helping then SLP evaluation will be helpful Screening colonoscopy will be recommended at some point after his acute issues are dealt with Further work-up and management and repeat LFTs will be helpful after acute issues develop to ensure no other etiology for his abnormalities Neurology referral for further work-up and management of Bell's palsy Okay for now to continue omeprazole 20 daily   Orders Placed This Encounter  Procedures  . Ambulatory referral to Gastroenterology  . Ambulatory referral to Neurology    New Prescriptions   ONDANSETRON (ZOFRAN ODT) 8 MG DISINTEGRATING TABLET    Take 1 tablet (8 mg total) by mouth every 8 (eight) hours as needed for nausea or vomiting.    Modified Medications   No medications on file    Planned Follow Up No follow-ups on file.   Total Time in Face-to-Face and in Coordination of Care for patient including independent/personal interpretation/review of prior testing, medical history, examination, medication adjustment, communicating results with the patient directly, and documentation with the EHR is 45 minutes.   Corliss Parish, MD [facility] Office # [phone]

## 2020-09-30 ENCOUNTER — Encounter: Payer: BC Managed Care – PPO | Admitting: Gastroenterology

## 2020-11-11 ENCOUNTER — Ambulatory Visit: Payer: BC Managed Care – PPO | Admitting: Internal Medicine

## 2021-08-05 IMAGING — DX DG CHEST 1V PORT
1 series · 1 of 1 positions shown · non-contrast
Comparison: 09/12/2020

CLINICAL DATA: Hiccups.  Emesis.

EXAM:
PORTABLE CHEST 1 VIEW

[chest ap]
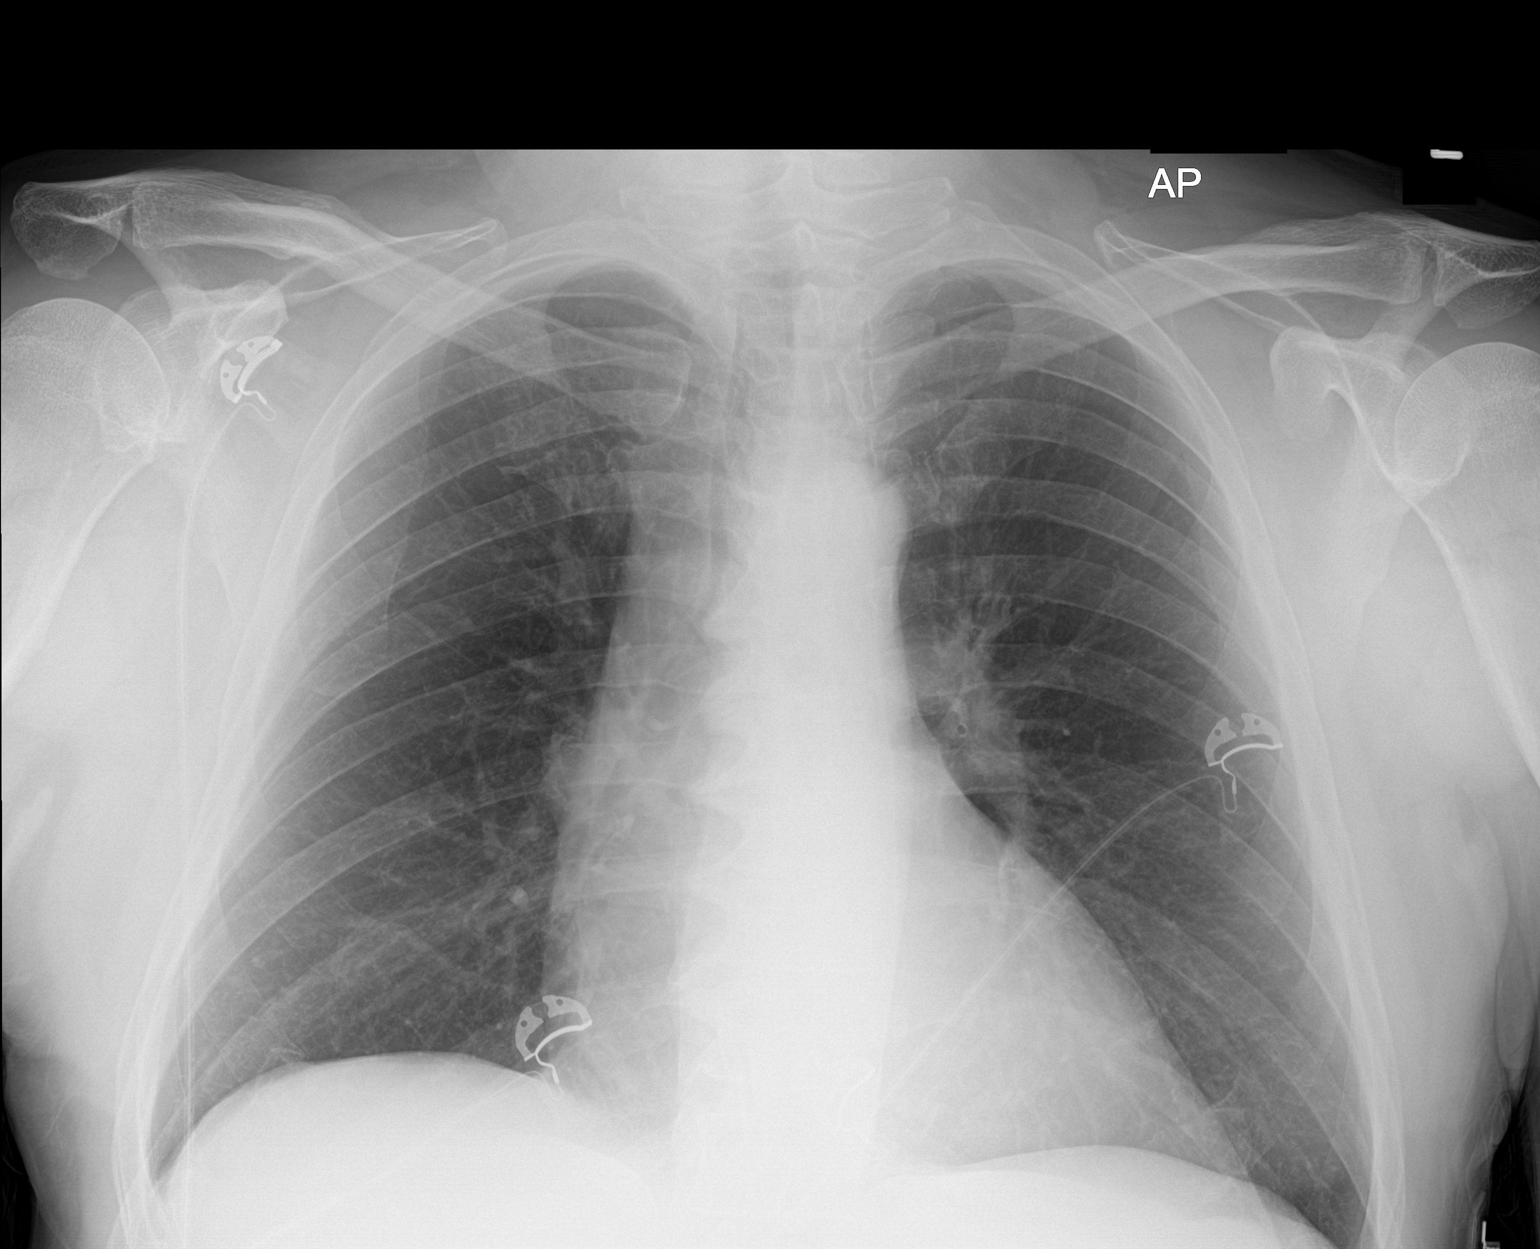

[1 of 1 positions shown; findings below may reference images not displayed]

FINDINGS: The cardiomediastinal contours are normal. No pneumomediastinum. The
lungs are clear. Pulmonary vasculature is normal. No consolidation,
pleural effusion, or pneumothorax. No acute osseous abnormalities
are seen.
IMPRESSION: No acute chest findings.

## 2021-08-05 IMAGING — CT CT ABD-PELV W/ CM
2 of 5 series · 16 of 46 positions shown, 18 images · IV contrast (Omnipaque)
Comparison: None.

CLINICAL DATA: Diverticulitis suspected.  Vomiting.

EXAM:
CT ABDOMEN AND PELVIS WITH CONTRAST
TECHNIQUE: Multidetector CT imaging of the abdomen and pelvis was performed
using the standard protocol following bolus administration of
intravenous contrast.
CONTRAST:  100mL OMNIPAQUE IOHEXOL 300 MG/ML  SOLN

[Series 2: axial st · axial · 0.81mm/px · z∈[+498,+908]mm · 13 of 92 slices shown, 15 images]
[im 5/92  soft-tissue]
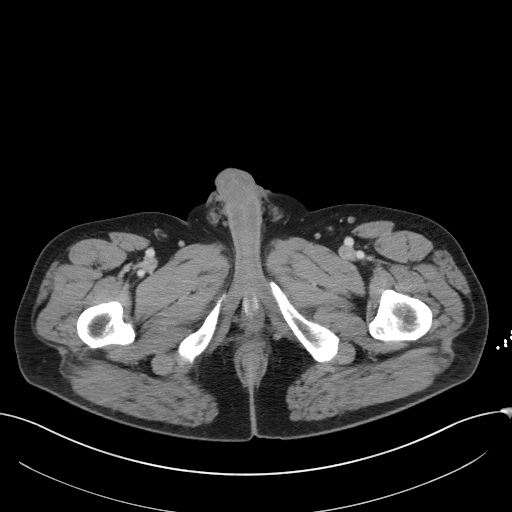
[im 5/92  bone]
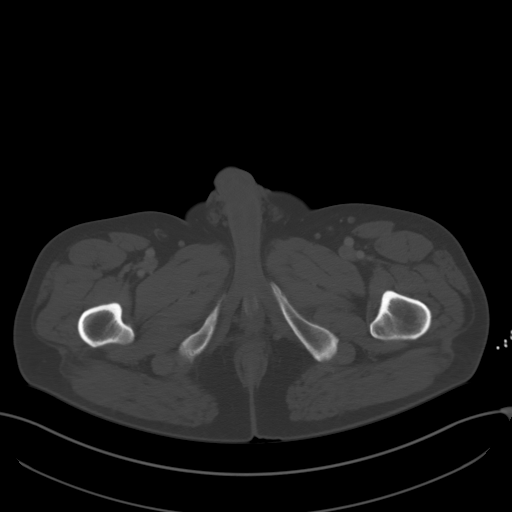
[im 15/92  soft-tissue]
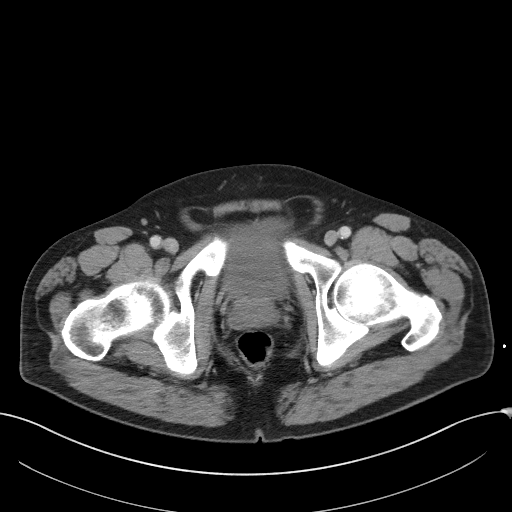
[im 20/92  soft-tissue]
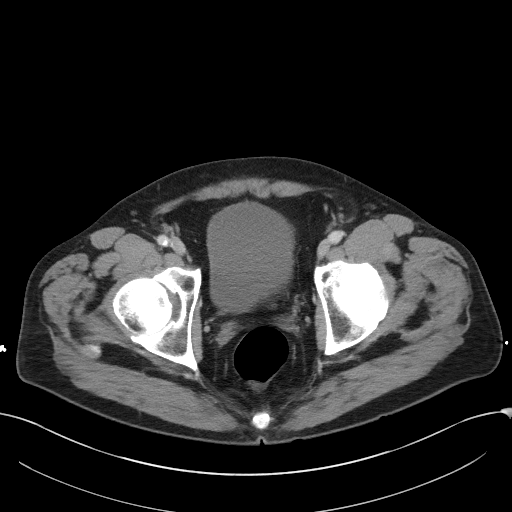
[im 24/92  soft-tissue]
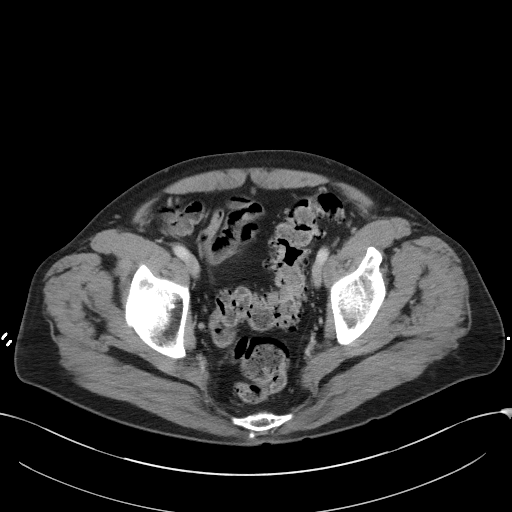
[im 34/92  soft-tissue]
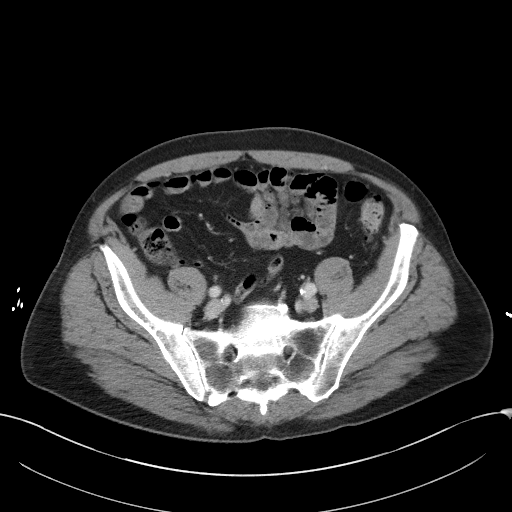
[im 39/92  soft-tissue]
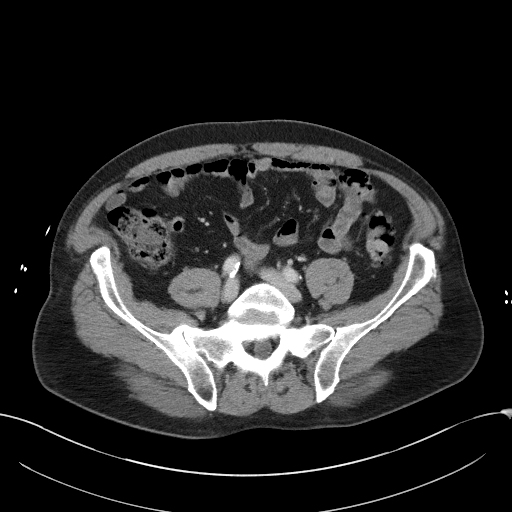
[im 48/92  soft-tissue]
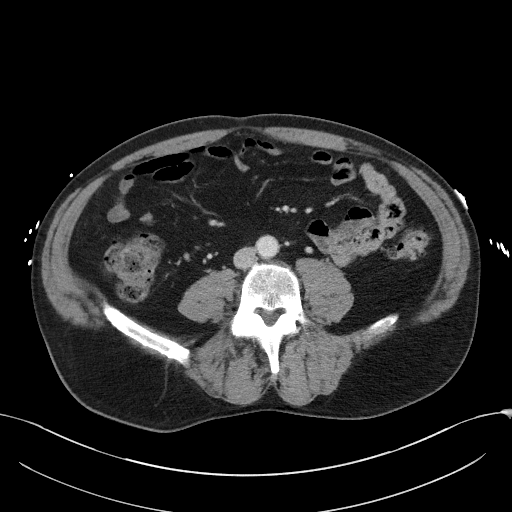
[im 53/92  soft-tissue]
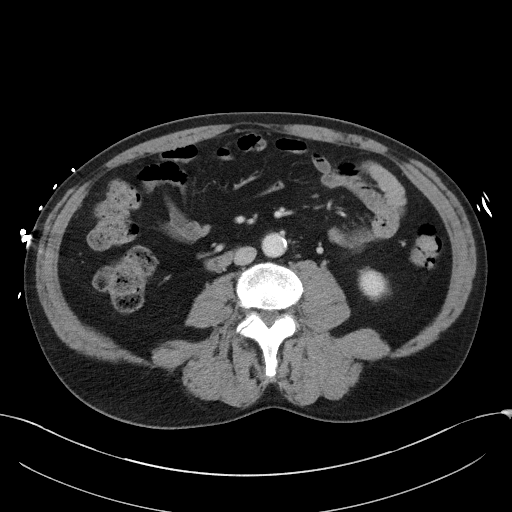
[im 58/92  soft-tissue]
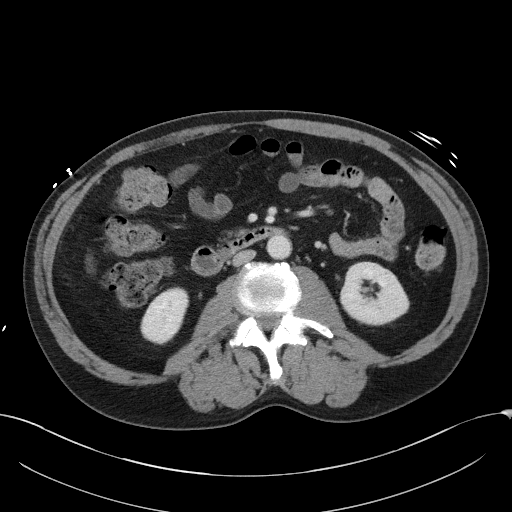
[im 58/92  bone]
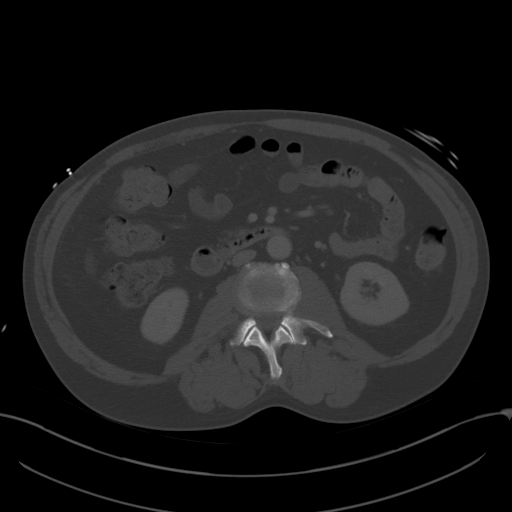
[im 68/92  soft-tissue]
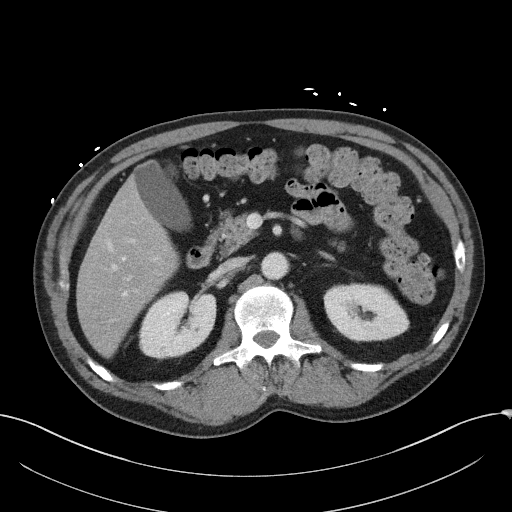
[im 72/92  soft-tissue]
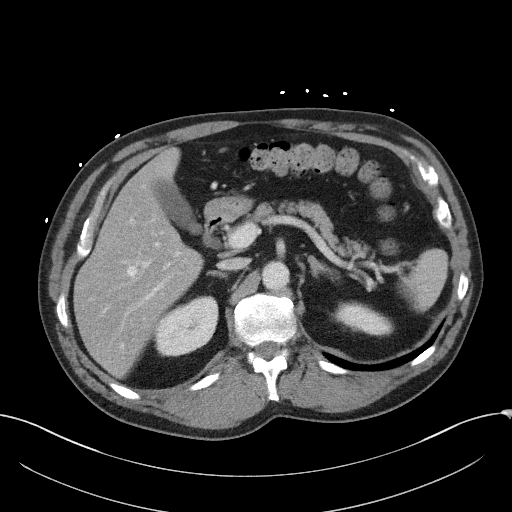
[im 77/92  soft-tissue]
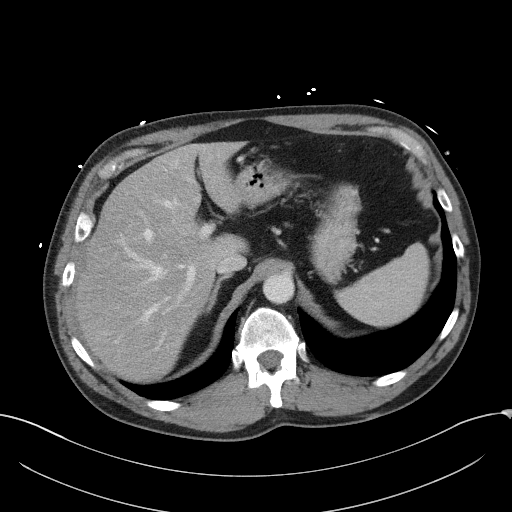
[im 87/92  soft-tissue]
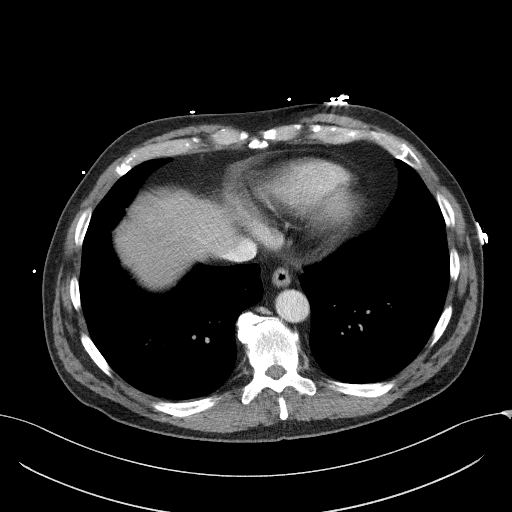

[Series 5: coronal st · coronal · 0.84mm/px · 3 of 93 slices shown]
[im 31/93  soft-tissue]
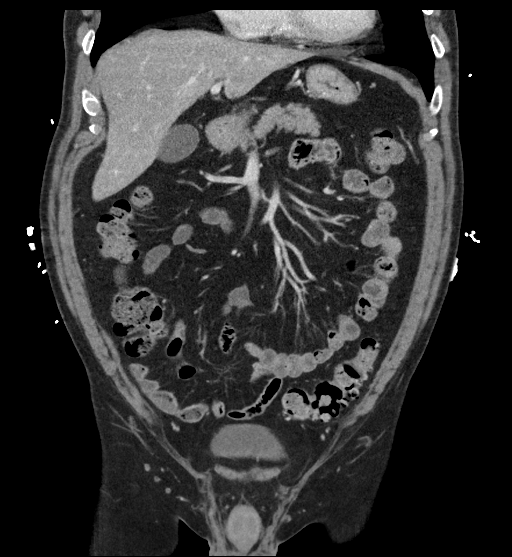
[im 41/93  soft-tissue]
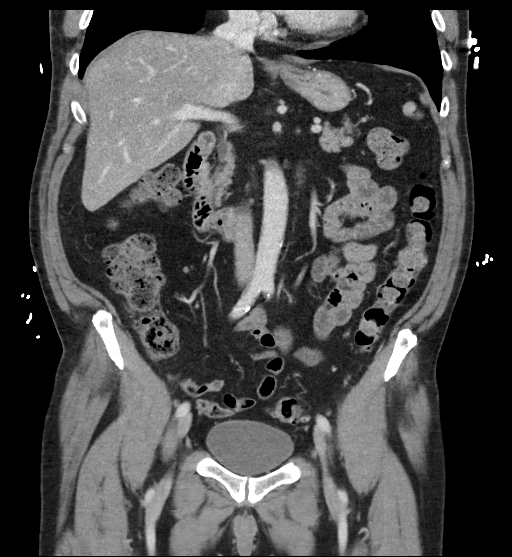
[im 52/93  soft-tissue]
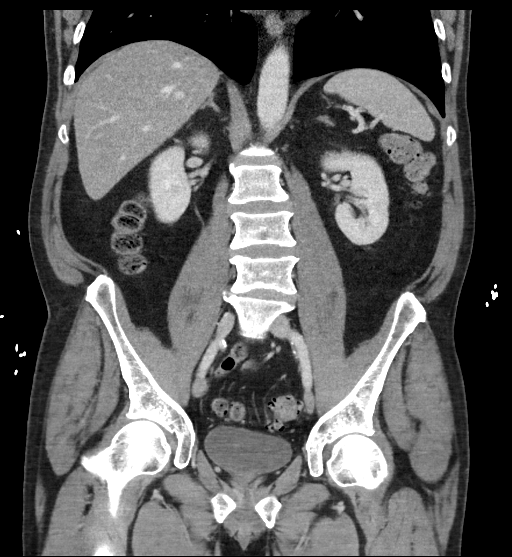

[16 of 46 positions shown; findings below may reference images not displayed]

FINDINGS: Lower chest: Lung bases are clear. No consolidation or pleural
fluid.

Hepatobiliary: No focal liver abnormality is seen. Mild decreased
hepatic density consistent with steatosis. No gallstones,
gallbladder wall thickening, or biliary dilatation.

Pancreas: No ductal dilatation or inflammation.

Spleen: Normal in size without focal abnormality.

Adrenals/Urinary Tract: Normal adrenal glands. No hydronephrosis or
perinephric edema. Homogeneous renal enhancement with symmetric
excretion on delayed phase imaging. Tiny cortical hypodensity in the
upper right and lower left kidney are too small to characterize but
likely small cyst. Urinary bladder is physiologically distended
without wall thickening.

Stomach/Bowel: Decompressed stomach. Normal positioning of the
duodenum and ligament of Treitz. No small bowel obstruction, BS wall
thickening or inflammatory change. Normal appendix. Moderate volume
of stool throughout the colon. Diverticulosis involving the
descending and sigmoid colon. No acute diverticulitis or colonic
wall thickening. No pericolonic edema.

Vascular/Lymphatic: Aorto bi-iliac atherosclerosis. No aortic
aneurysm. Circumaortic left renal vein. Patent portal vein. No bulky
abdominopelvic adenopathy.

Reproductive: Prostate is unremarkable.

Other: No free air, free fluid, or intra-abdominal fluid collection.
Minimal fat in the inguinal canals.

Musculoskeletal: Avascular necrosis of the left femoral head without
collapse. There is mild degenerative change in the spine. No acute
osseous abnormalities.
IMPRESSION: 1. No acute abnormality in the abdomen/pelvis.
2. Colonic diverticulosis without diverticulitis.
3. Mild hepatic steatosis.
4. Avascular necrosis of the left femoral head without collapse.

Aortic Atherosclerosis ([id number]-CN7.7).
